# Patient Record
Sex: Female | Born: 1970 | Race: White | Hispanic: No | Marital: Married | Smoking: Never smoker
Health system: Southern US, Community
[De-identification: ages and names within clinical notes are randomized; demographics above are authoritative.]

## PROBLEM LIST (undated history)

## (undated) DIAGNOSIS — Z79890 Hormone replacement therapy (postmenopausal): Secondary | ICD-10-CM

## (undated) DIAGNOSIS — I10 Essential (primary) hypertension: Secondary | ICD-10-CM

## (undated) DIAGNOSIS — F419 Anxiety disorder, unspecified: Secondary | ICD-10-CM

## (undated) HISTORY — PX: ABDOMINAL HYSTERECTOMY: SHX81

---

## 2009-09-24 ENCOUNTER — Encounter: Admission: RE | Admit: 2009-09-24 | Discharge: 2009-09-24 | Payer: Self-pay | Admitting: Orthopedic Surgery

## 2010-03-11 ENCOUNTER — Ambulatory Visit: Payer: Self-pay | Admitting: Emergency Medicine

## 2010-03-11 DIAGNOSIS — J0100 Acute maxillary sinusitis, unspecified: Secondary | ICD-10-CM | POA: Insufficient documentation

## 2010-03-21 NOTE — Assessment & Plan Note (Signed)
Summary: SINUS INFEC?/TM room 4   Vital Signs:  Patient Profile:   40 Years Old Female CC:      possible sinus infection Weight:      173.75 pounds O2 Sat:      99 % O2 treatment:    Room Air Temp:     97.9 degrees F oral Pulse rate:   92 / minute Resp:     16 per minute BP sitting:   133 / 89  (left arm) Cuff size:   regular  Vitals Entered By: Clemens Catholic LPN (March 11, 2010 1:14 PM)                  Updated Prior Medication List: PRISTIQ 50 MG XR24H-TAB (DESVENLAFAXINE SUCCINATE)  VIVELLE-DOT 0.0375 MG/24HR PTTW (ESTRADIOL)   Current Allergies: No known allergies History of Present Illness History from: patient Chief Complaint: possible sinus infection History of Present Illness: 40 Years Old Female complains of onset of cold symptoms for3-4 days.  Aarica has been using Dayquil/Nyquil which is helping a little bit.  She had similar symptoms a few weeks ago. No sore throat + cough + upper teeth pain No pleuritic pain No wheezing + nasal congestion + post-nasal drainage + sinus pain/pressure No chest congestion No itchy/red eyes No earache No hemoptysis No SOB No chills/sweats No fever No nausea No vomiting No abdominal pain No diarrhea No skin rashes No fatigue No myalgias + headache   REVIEW OF SYSTEMS Constitutional Symptoms      Denies fever, chills, night sweats, weight loss, weight gain, and fatigue.  Eyes       Denies change in vision, eye pain, eye discharge, glasses, contact lenses, and eye surgery. Ear/Nose/Throat/Mouth       Complains of frequent runny nose, sinus problems, and sore throat.      Denies hearing loss/aids, change in hearing, ear pain, ear discharge, dizziness, frequent nose bleeds, hoarseness, and tooth pain or bleeding.  Respiratory       Complains of dry cough.      Denies productive cough, wheezing, shortness of breath, asthma, bronchitis, and emphysema/COPD.  Cardiovascular       Denies murmurs, chest pain, and  tires easily with exhertion.    Gastrointestinal       Denies stomach pain, nausea/vomiting, diarrhea, constipation, blood in bowel movements, and indigestion. Genitourniary       Denies painful urination, kidney stones, and loss of urinary control. Neurological       Complains of headaches.      Denies paralysis, seizures, and fainting/blackouts. Musculoskeletal       Denies muscle pain, joint pain, joint stiffness, decreased range of motion, redness, swelling, muscle weakness, and gout.  Skin       Denies bruising, unusual mles/lumps or sores, and hair/skin or nail changes.  Psych       Denies mood changes, temper/anger issues, anxiety/stress, speech problems, depression, and sleep problems. Other Comments: pt c/o sinus pressure, teeth hurt, cough, stuffy nose x 4 days. she has taken OTC dayquil.   Past History:  Past Medical History: Unremarkable  Past Surgical History: Hysterectomy  Family History: none  Social History: Never Smoked Alcohol use-yes socially Drug use-no Smoking Status:  never Drug Use:  no Physical Exam General appearance: well developed, well nourished, no acute distress Head: maxillary sinus tenderness Ears: normal, no lesions or deformities Nasal: clear discharge & congestion Oral/Pharynx: clear PND, no erythema Neck: neck supple,  trachea midline, no masses Chest/Lungs: no rales,  wheezes, or rhonchi bilateral, breath sounds equal without effort Heart: regular rate and  rhythm, no murmur MSE: oriented to time, place, and person Assessment New Problems: SINUSITIS, MAXILLARY, ACUTE (ICD-461.0)   Patient Education: Patient and/or caregiver instructed in the following: rest, fluids, Tylenol prn, Ibuprofen prn.  Plan New Medications/Changes: AMOXICILLIN 875 MG TABS (AMOXICILLIN) 1 by mouth two times a day for 7 days  #14 x 0, 03/11/2010, Hoyt Koch MD  New Orders: New Patient Level II [phone] Planning Comments:   1)  Take the  prescribed antibiotic as instructed.  Wait a few days prior to taking since likely viral at this time. 2)  Use nasal saline solution (over the counter) at least 3 times a day. 3)  Use over the counter decongestants like Zyrtec-D every 12 hours as needed to help with congestion. 4)  Can take tylenol every 6 hours or motrin every 8 hours for pain or fever. 5)  Follow up with your primary doctor  if no improvement in 5-7 days, sooner if increasing pain, fever, or new symptoms.    The patient and/or caregiver has been counseled thoroughly with regard to medications prescribed including dosage, schedule, interactions, rationale for use, and possible side effects and they verbalize understanding.  Diagnoses and expected course of recovery discussed and will return if not improved as expected or if the condition worsens. Patient and/or caregiver verbalized understanding.  Prescriptions: AMOXICILLIN 875 MG TABS (AMOXICILLIN) 1 by mouth two times a day for 7 days  #14 x 0   Entered and Authorized by:   Hoyt Koch MD   Signed by:   Hoyt Koch MD on 03/11/2010   Method used:   Print then Give to Patient   RxID:   [id number]   Orders Added: 1)  New Patient Level II [41660]

## 2011-02-03 ENCOUNTER — Emergency Department
Admission: EM | Admit: 2011-02-03 | Discharge: 2011-02-03 | Disposition: A | Discharge status: Home / Self Care | Payer: Self-pay | Source: Home / Self Care | Attending: Emergency Medicine | Admitting: Emergency Medicine

## 2011-02-03 ENCOUNTER — Encounter: Payer: Self-pay | Admitting: Emergency Medicine

## 2011-02-03 DIAGNOSIS — J329 Chronic sinusitis, unspecified: Secondary | ICD-10-CM

## 2011-02-03 HISTORY — DX: Hormone replacement therapy: Z79.890

## 2011-02-03 HISTORY — DX: Anxiety disorder, unspecified: F41.9

## 2011-02-03 MED ORDER — PSEUDOEPHEDRINE-GUAIFENESIN ER 60-600 MG PO TB12: ORAL_TABLET | ORAL | Status: AC

## 2011-02-03 MED ORDER — FLUTICASONE PROPIONATE 50 MCG/ACT NA SUSP: NASAL | Status: DC

## 2011-02-03 MED ORDER — AMOXICILLIN 875 MG PO TABS: 875.0000 mg | ORAL_TABLET | Freq: Two times a day (BID) | ORAL | Status: AC

## 2011-02-03 NOTE — ED Notes (Signed)
Sinus congestion, cough, and sore throat x 4 days. No Flu vaccine this season.

## 2011-02-03 NOTE — ED Provider Notes (Signed)
History   Several days of worsening congestion and cough, associated with discolored mucus and fever. Has tried over-the-counter treatments without significant improvement.  CSN: [id number]  Arrival date & time 02/03/11  1120   First MD Initiated Contact with Patient 02/03/11 1147      Chief Complaint  Patient presents with  . Nasal Congestion    (Consider location/radiation/quality/duration/timing/severity/associated sxs/prior treatment) HPI  Past Medical History  Diagnosis Date  . Anxiety   . Hormone replacement therapy (postmenopausal)     Past Surgical History  Procedure Date  . Abdominal hysterectomy     No family history on file.  History  Substance Use Topics  . Smoking status: Never Smoker   . Smokeless tobacco: Not on file  . Alcohol Use: No    OB History    Grav Para Term Preterm Abortions TAB SAB Ect Mult Living                  Review of Systems  Constitutional: Positive for fatigue.  HENT: Negative for hearing loss, nosebleeds and facial swelling.   Eyes: Negative.   Respiratory: Positive for cough (Minimal). Negative for chest tightness, shortness of breath, wheezing and stridor.   Cardiovascular: Negative.   Gastrointestinal: Negative.   Genitourinary: Negative.   Musculoskeletal: Negative.   Neurological: Negative.   Hematological: Negative.     Allergies  Review of patient's allergies indicates no known allergies.  Home Medications   Current Outpatient Rx  Name Route Sig Dispense Refill  . DESVENLAFAXINE SUCCINATE ER 50 MG PO TB24 Oral Take 50 mg by mouth daily.      Marland Kitchen ESTRADIOL 0.025 MG/24HR TD PTTW Transdermal Place 1 patch onto the skin 2 (two) times a week.      . AMOXICILLIN 875 MG PO TABS Oral Take 1 tablet (875 mg total) by mouth 2 (two) times daily. Take for 10 days. 20 tablet 0  . FLUTICASONE PROPIONATE 50 MCG/ACT NA SUSP  1 or 2 sprays each nostril twice a day 16 g 0  . PSEUDOEPHEDRINE-GUAIFENESIN 60-600 MG PO TB12  Take 1  every 12 hours as needed for congestion. Do not take if you have high blood pressure. Avoid taking late at night. 20 tablet 0    Pulse 94  Temp(Src) 98.4 F (36.9 C) (Oral)  Resp 16  Ht 5\' 6"  (1.676 m)  Wt 164 lb (74.39 kg)  BMI 26.47 kg/m2  SpO2 98%  Physical Exam  Nursing note and vitals reviewed. Constitutional: She is oriented to person, place, and time. She appears well-developed and well-nourished. No distress.  HENT:  Head: Normocephalic and atraumatic.  Right Ear: Tympanic membrane, external ear and ear canal normal.  Left Ear: Tympanic membrane, external ear and ear canal normal.  Nose: Mucosal edema and rhinorrhea present. Right sinus exhibits maxillary sinus tenderness. Left sinus exhibits maxillary sinus tenderness.  Mouth/Throat: Oropharynx is clear and moist. No oral lesions. No oropharyngeal exudate.  Eyes: Right eye exhibits no discharge. Left eye exhibits no discharge. No scleral icterus.  Neck: Neck supple.  Cardiovascular: Normal rate, regular rhythm and normal heart sounds.   Pulmonary/Chest: Effort normal and breath sounds normal. She has no wheezes. She has no rales.  Lymphadenopathy:    She has no cervical adenopathy.  Neurological: She is alert and oriented to person, place, and time.  Skin: Skin is warm and dry.    ED Course  Procedures (including critical care time)  Labs Reviewed - No data to display No results  found.   1. Sinusitis       MDM  See the AVS        Lonell Face, MD 02/03/11 1204

## 2015-06-30 ENCOUNTER — Emergency Department
Admission: EM | Admit: 2015-06-30 | Discharge: 2015-06-30 | Disposition: A | Discharge status: Home / Self Care | Payer: BC Managed Care – PPO | Source: Home / Self Care | Attending: Family Medicine | Admitting: Family Medicine

## 2015-06-30 DIAGNOSIS — R350 Frequency of micturition: Secondary | ICD-10-CM | POA: Diagnosis not present

## 2015-06-30 DIAGNOSIS — IMO0001 Reserved for inherently not codable concepts without codable children: Secondary | ICD-10-CM

## 2015-06-30 DIAGNOSIS — R1011 Right upper quadrant pain: Secondary | ICD-10-CM | POA: Diagnosis not present

## 2015-06-30 DIAGNOSIS — R109 Unspecified abdominal pain: Secondary | ICD-10-CM | POA: Diagnosis not present

## 2015-06-30 DIAGNOSIS — R030 Elevated blood-pressure reading, without diagnosis of hypertension: Secondary | ICD-10-CM | POA: Diagnosis not present

## 2015-06-30 LAB — POCT CBC W AUTO DIFF (K'VILLE URGENT CARE)

## 2015-06-30 LAB — POCT URINALYSIS DIP (MANUAL ENTRY)
Bilirubin, UA: NEGATIVE
Blood, UA: NEGATIVE
Glucose, UA: NEGATIVE
Ketones, POC UA: NEGATIVE
Nitrite, UA: NEGATIVE
Protein Ur, POC: NEGATIVE
Spec Grav, UA: 1.02 (ref 1.005–1.03)
Urobilinogen, UA: 0.2 (ref 0–1)
pH, UA: 7 (ref 5–8)

## 2015-06-30 MED ORDER — NAPROXEN 500 MG PO TABS
500.0000 mg | ORAL_TABLET | Freq: Two times a day (BID) | ORAL | Status: DC
Start: 2015-06-30 — End: 2016-01-21

## 2015-06-30 MED ORDER — CEPHALEXIN 500 MG PO CAPS: 500.0000 mg | ORAL_CAPSULE | Freq: Two times a day (BID) | ORAL | Status: DC

## 2015-06-30 MED ORDER — HYDROCODONE-ACETAMINOPHEN 5-325 MG PO TABS: 1.0000 | ORAL_TABLET | Freq: Four times a day (QID) | ORAL | Status: DC | PRN

## 2015-06-30 MED ORDER — IBUPROFEN 600 MG PO TABS
600.0000 mg | ORAL_TABLET | Freq: Four times a day (QID) | ORAL | Status: AC | PRN
  Administered 2015-06-30: 600 mg via ORAL

## 2015-06-30 NOTE — ED Provider Notes (Signed)
CSN: [id number]     Arrival date & time 06/30/15  1907 History   None    Chief Complaint  Patient presents with  . Back Pain   (Consider location/radiation/quality/duration/timing/severity/associated sxs/prior Treatment) HPI The pt is a 45yo female presenting to [facility] with c/o sudden onset Right sided flank pain that started yesterday morning and has gradually worsened since.  She has tried a heating pad and ibuprofen with minimal relief.  Difficulty getting comfortable all day. Pain is 9/10 at this time.  Denies n/v/d, fever or chills. She has noticed urinary frequency but no pain with urination or hematuria. Denies hx of kidney stones. No hx of abdominal surgeries. Denies spinal back pain but has had some back pain under her Right shoulder blade.    BP elevated in triage. Pt denies headache, dizziness, chest pain or SOB. No hx of HTN however pain is 9/10.   Past Medical History  Diagnosis Date  . Anxiety   . Hormone replacement therapy (postmenopausal)    Past Surgical History  Procedure Laterality Date  . Abdominal hysterectomy     History reviewed. No pertinent family history. Social History  Substance Use Topics  . Smoking status: Never Smoker   . Smokeless tobacco: None  . Alcohol Use: No   OB History    No data available     Review of Systems  Constitutional: Negative for fever and chills.  HENT: Negative for congestion, ear pain, sore throat, trouble swallowing and voice change.   Respiratory: Negative for cough and shortness of breath.   Cardiovascular: Negative for chest pain and palpitations.  Gastrointestinal: Positive for abdominal pain (Right side). Negative for nausea, vomiting and diarrhea.  Genitourinary: Positive for frequency and flank pain ( Right). Negative for dysuria, urgency and hematuria.  Musculoskeletal: Positive for back pain (Right side). Negative for myalgias and arthralgias.  Skin: Negative for rash.    Allergies  Review of patient's allergies  indicates no known allergies.  Home Medications   Prior to Admission medications   Medication Sig Start Date End Date Taking? Authorizing Provider  cephALEXin (KEFLEX) 500 MG capsule Take 1 capsule (500 mg total) by mouth 2 (two) times daily. For 7 days 06/30/15   Junius Finner, PA-C  desvenlafaxine (PRISTIQ) 50 MG 24 hr tablet Take 50 mg by mouth daily.      Historical Provider, MD  estradiol (VIVELLE-DOT) 0.025 MG/24HR Place 1 patch onto the skin 2 (two) times a week.      Historical Provider, MD  fluticasone Aleda Grana) 50 MCG/ACT nasal spray 1 or 2 sprays each nostril twice a day 02/03/11 02/03/12  Lajean Manes, MD  HYDROcodone-acetaminophen (NORCO/VICODIN) 5-325 MG tablet Take 1 tablet by mouth every 6 (six) hours as needed for moderate pain or severe pain. 06/30/15   Junius Finner, PA-C  naproxen (NAPROSYN) 500 MG tablet Take 1 tablet (500 mg total) by mouth 2 (two) times daily. 06/30/15   Junius Finner, PA-C   Meds Ordered and Administered this Visit   Medications  ibuprofen (ADVIL,MOTRIN) tablet 600 mg (600 mg Oral Given 06/30/15 1936)    BP 166/101 mmHg  Pulse 97  Temp(Src) 98.4 F (36.9 C) (Oral)  Ht  (1.676 m)  SpO2 98% No data found.   Physical Exam  Constitutional: She appears well-developed and well-nourished.  Standing in exam room, pacing, appears uncomfortable.   HENT:  Head: Normocephalic and atraumatic.  Mouth/Throat: Oropharynx is clear and moist.  Eyes: Conjunctivae are normal. No scleral icterus.  Neck:  Normal range of motion.  Cardiovascular: Normal rate, regular rhythm and normal heart sounds.   Pulmonary/Chest: Effort normal and breath sounds normal. No respiratory distress. She has no wheezes. She has no rales. She exhibits no tenderness.  Abdominal: Soft. Bowel sounds are normal. She exhibits no distension and no mass. There is tenderness. There is no rebound, no guarding and no CVA tenderness.    Soft, non-distended. Diffuse upper and Right sided  abdominal and flank pain. No guarding or rebound.   Musculoskeletal: Normal range of motion.  Neurological: She is alert.  Skin: Skin is warm and dry. She is not diaphoretic.  Nursing note and vitals reviewed.   ED Course  Procedures (including critical care time)  Labs Review Labs Reviewed  COMPLETE METABOLIC PANEL WITH GFR - Abnormal; Notable for the following:    Glucose, Bld 103 (*)    All other components within normal limits   Narrative:    Performed at:  [facility]                [address], [address]                [place], [place] [id number]  POCT URINALYSIS DIP (MANUAL ENTRY) - Abnormal; Notable for the following:    Leukocytes, UA Trace (*)    All other components within normal limits  URINE CULTURE  POCT CBC W AUTO DIFF ([facility])    Imaging Review No results found.    MDM   1. Right flank pain   2. Urinary frequency   3. Abdominal pain, right upper quadrant   4. Elevated blood pressure    Pt c/o Right flank pain, urinary frequency since yesterday. Tenderness to RUQ on exam and Right flank.  BP elevated. No prior hx of HTN.  Possibly elevated due to pain. Pt denies chest pain, headache, dizziness or SOB.  Ibuprofen 600mg  given in UC. UA: trace leukocytes, will send culture but also treat for potential early UTI CBC: unremarkable  CMP pending.  Lower concern for cholecystitis due to pt being afebrile, no leukocytosis, or vomiting and urinary frequency present. Pain more likely due to kidney stone.  CT and U/S unavailable at this time.  Advised pt she go to emergency department later tonight or this weekend if pain persists despite taking Vicodin being prescribed, or if symptoms worsen including fever develops or vomiting.  Encouraged to f/u with PCP next week for BP recheck. Patient and husband verbalized understanding and agreement with treatment plan.     Junius FinnerErin O'Malley, PA-C 07/01/15 [phone]

## 2015-06-30 NOTE — ED Notes (Signed)
Rechecked BP 169/107.  E O'malley PA-C notified

## 2015-06-30 NOTE — ED Notes (Signed)
Noticed some frequency with urination today.  Denies burning.

## 2015-06-30 NOTE — Discharge Instructions (Signed)
°  Norco/Vicodin (hydrocodone-acetaminophen) is a narcotic pain medication, do not combine these medications with others containing tylenol. While taking, do not drink alcohol, drive, or perform any other activities that requires focus while taking these medications.  ° °

## 2015-06-30 NOTE — ED Notes (Signed)
Started yesterday morning with right side pain.  Became worse as the day progressed.  Tried ibuprofen and heat last night, but could not get comfortable.  Has become worse today.

## 2015-07-01 ENCOUNTER — Telehealth: Payer: Self-pay | Admitting: Emergency Medicine

## 2015-07-01 LAB — COMPLETE METABOLIC PANEL WITH GFR
ALT: 17 U/L (ref 6–29)
AST: 18 U/L (ref 10–35)
Albumin: 4.4 g/dL (ref 3.6–5.1)
Alkaline Phosphatase: 75 U/L (ref 33–115)
BUN: 11 mg/dL (ref 7–25)
CO2: 25 mmol/L (ref 20–31)
Calcium: 9.3 mg/dL (ref 8.6–10.2)
Chloride: 103 mmol/L (ref 98–110)
Creat: 0.93 mg/dL (ref 0.50–1.10)
GFR, Est African American: 85 mL/min (ref >=60)
GFR, Est Non African American: 74 mL/min (ref >=60)
Glucose, Bld: 103 mg/dL — ABNORMAL HIGH (ref 65–99)
Potassium: 4.1 mmol/L (ref 3.5–5.3)
Sodium: 140 mmol/L (ref 135–146)
Total Bilirubin: 0.6 mg/dL (ref 0.2–1.2)
Total Protein: 7.1 g/dL (ref 6.1–8.1)

## 2015-07-02 LAB — URINE CULTURE: Colony Count: 70000

## 2016-01-21 ENCOUNTER — Encounter: Payer: Self-pay | Admitting: Emergency Medicine

## 2016-01-21 ENCOUNTER — Emergency Department
Admission: EM | Admit: 2016-01-21 | Discharge: 2016-01-21 | Disposition: A | Discharge status: Home / Self Care | Payer: BC Managed Care – PPO | Source: Home / Self Care | Attending: Family Medicine | Admitting: Family Medicine

## 2016-01-21 DIAGNOSIS — R110 Nausea: Secondary | ICD-10-CM

## 2016-01-21 DIAGNOSIS — R059 Cough, unspecified: Secondary | ICD-10-CM

## 2016-01-21 DIAGNOSIS — R05 Cough: Secondary | ICD-10-CM

## 2016-01-21 DIAGNOSIS — R197 Diarrhea, unspecified: Secondary | ICD-10-CM | POA: Diagnosis not present

## 2016-01-21 DIAGNOSIS — R519 Headache, unspecified: Secondary | ICD-10-CM

## 2016-01-21 DIAGNOSIS — R51 Headache: Secondary | ICD-10-CM

## 2016-01-21 LAB — POCT CBC W AUTO DIFF (K'VILLE URGENT CARE)

## 2016-01-21 MED ORDER — FLUTICASONE PROPIONATE 50 MCG/ACT NA SUSP: 2.0000 | Freq: Every day | NASAL | 2 refills | Status: AC

## 2016-01-21 MED ORDER — BENZONATATE 100 MG PO CAPS: 100.0000 mg | ORAL_CAPSULE | Freq: Three times a day (TID) | ORAL | 0 refills | Status: DC

## 2016-01-21 MED ORDER — PROMETHAZINE HCL 25 MG PO TABS: 25.0000 mg | ORAL_TABLET | Freq: Four times a day (QID) | ORAL | 0 refills | Status: DC | PRN

## 2016-01-21 MED ORDER — PROMETHAZINE HCL 25 MG/ML IJ SOLN
25.0000 mg | Freq: Once | INTRAMUSCULAR | Status: AC
  Administered 2016-01-21: 25 mg via INTRAMUSCULAR

## 2016-01-21 MED ORDER — KETOROLAC TROMETHAMINE 60 MG/2ML IM SOLN
60.0000 mg | Freq: Once | INTRAMUSCULAR | Status: AC
  Administered 2016-01-21: 60 mg via INTRAMUSCULAR

## 2016-01-21 NOTE — ED Triage Notes (Signed)
Pt c/o cough, nausea, diarrhea x 3 days, dry heaving a lot, has already taking Zofran x 4 this morning.

## 2016-01-21 NOTE — Discharge Instructions (Signed)
°  Your symptoms are likely due to a viral illness.  It is important to get at least 8-10 hours of sleep at night while sick.  Be sure to stay well hydrated, you may also drink sports drinks such as Gatorade and Powerade to help replenish electrolytes lost with diarrhea or if you develop vomiting.  If the strong taste causes/worsens nausea, you may water these drinks down. Broth, crackers, rice, and other bland foods are good to eat while sick.  Avoid fried/fatty foods or dairy which can cause the stomach to work harder.   If you develop severe abdominal pain, cannot keep fluids down, chest pain, difficulty breathing, passing out, or other new concerning symptoms, please call 911 or go to closest emergency department.

## 2016-01-21 NOTE — ED Provider Notes (Signed)
CSN: [id number]     Arrival date & time 01/21/16  1130 History   First MD Initiated Contact with Patient 01/21/16 1143     Chief Complaint  Patient presents with  . Nausea  . Diarrhea  . Cough   (Consider location/radiation/quality/duration/timing/severity/associated sxs/prior Treatment) HPI Lucille PassyMelissa Detter is a 45 y.o. female presenting to UC with c/o 3 days of intermittent dry cough, with associated nausea with dry heaves this morning as well as an episode of loose stools this morning w/o blood or mucous in the stools.  Pt notes she took 4mg  zofran this morning w/o relief.  She also took some leftover cough medication with hydrocodone last night w/o relief.  She has not ate or drank anything this morning due to the nausea. Pt also c/o mild frontal headache but denies dizziness or change in vision. She has not taken any acetaminophen or ibuprofen for her headache.  Denies hx of migraines. Denies nasal congestion or sore throat. Denies fever or chills.  Denies abdominal pain or urinary symptoms.  She did get the flu vaccine this season.  No sick contacts or recent travel.     Past Medical History:  Diagnosis Date  . Anxiety   . Hormone replacement therapy (postmenopausal)    Past Surgical History:  Procedure Laterality Date  . ABDOMINAL HYSTERECTOMY     No family history on file. Social History  Substance Use Topics  . Smoking status: Never Smoker  . Smokeless tobacco: Never Used  . Alcohol use No   OB History    No data available     Review of Systems  Constitutional: Positive for appetite change and fatigue. Negative for chills and fever.  HENT: Positive for sinus pain. Negative for congestion, ear pain, sore throat, trouble swallowing and voice change.   Respiratory: Positive for cough. Negative for shortness of breath.   Cardiovascular: Negative for chest pain and palpitations.  Gastrointestinal: Positive for diarrhea ( loose stool) and nausea. Negative for abdominal pain  and vomiting.  Musculoskeletal: Negative for arthralgias, back pain and myalgias.  Skin: Negative for rash.  Neurological: Positive for headaches ( frontal). Negative for dizziness and light-headedness.    Allergies  Patient has no known allergies.  Home Medications   Prior to Admission medications   Medication Sig Start Date End Date Taking? Authorizing Provider  desvenlafaxine (PRISTIQ) 50 MG 24 hr tablet Take 50 mg by mouth daily.      Historical Provider, MD  fluticasone (FLONASE) 50 MCG/ACT nasal spray Place 2 sprays into both nostrils daily. 01/21/16   Junius FinnerErin O'Malley, PA-C  promethazine (PHENERGAN) 25 MG tablet Take 1 tablet (25 mg total) by mouth every 6 (six) hours as needed for nausea or vomiting. 01/21/16   Junius FinnerErin O'Malley, PA-C   Meds Ordered and Administered this Visit   Medications  ketorolac (TORADOL) injection 60 mg (60 mg Intramuscular Given 01/21/16 1155)  promethazine (PHENERGAN) injection 25 mg (25 mg Intramuscular Given 01/21/16 1222)    BP 139/90 (BP Location: Left Arm)   Pulse 91   Temp 97.8 F (36.6 C) (Oral)   Ht 5\' 6"  (1.676 m)   Wt 175 lb (79.4 kg)   SpO2 95%   BMI 28.25 kg/m  No data found.   Physical Exam  Constitutional: She is oriented to person, place, and time. She appears well-developed and well-nourished. No distress.  Pt lying on exam bed, does not appear to feel well but appears well hydrated and cooperative during exam.  HENT:  Head: Normocephalic and atraumatic.  Right Ear: Tympanic membrane normal.  Left Ear: Tympanic membrane normal.  Nose: Nose normal.  Mouth/Throat: Uvula is midline, oropharynx is clear and moist and mucous membranes are normal.  Eyes: EOM are normal.  Neck: Normal range of motion. Neck supple.  Cardiovascular: Normal rate and regular rhythm.   Pulmonary/Chest: Effort normal and breath sounds normal. No stridor. No respiratory distress. She has no wheezes. She has no rales.  Abdominal: Soft. She exhibits no  distension and no mass. There is no tenderness. There is no rebound, no guarding and no CVA tenderness.  Musculoskeletal: Normal range of motion.  Lymphadenopathy:    She has no cervical adenopathy.  Neurological: She is alert and oriented to person, place, and time.  Skin: Skin is warm and dry. She is not diaphoretic.  Psychiatric: She has a normal mood and affect. Her behavior is normal.  Nursing note and vitals reviewed.   Urgent Care Course   Clinical Course     Procedures (including critical care time)  Labs Review Labs Reviewed  POCT CBC W AUTO DIFF ([facility])    Imaging Review No results found.   Pt given Toradol 60mg  IM for headache, ice chips and crackers. Pt c/o severe nausea after eating a cracker. Phenergan 25mg  IM given.  Pt monitored for 20 minutes after phenergan. No vomiting.  Vitals: WNL  MDM   1. Cough   2. Nausea   3. Diarrhea, unspecified type   4. Frontal headache    Pt c/o 3 days of dry cough, that has developed into severe nausea and 1 episode of loose stools this morning. Pt appears fatigued but cooperative during exam. Afebrile. Moist mucous membranes. Lungs: CTAB. Abd: soft, non-tender. No coughing, dry heaving, or vomiting while in UC.  CBC: WNL   Pt feels comfortable being discharged home.  Daughter driving pt home.  Rx: phenergan, tessalon, and flonase  F/u with PCP in 3-4 days if not improving, sooner if worsening or go to emergency department.      Junius Finnerrin O'Malley, PA-C 01/21/16 [phone]

## 2016-08-05 ENCOUNTER — Emergency Department
Admission: EM | Admit: 2016-08-05 | Discharge: 2016-08-05 | Disposition: A | Discharge status: Home / Self Care | Payer: BC Managed Care – PPO | Source: Home / Self Care | Attending: Family Medicine | Admitting: Family Medicine

## 2016-08-05 ENCOUNTER — Encounter: Payer: Self-pay | Admitting: Emergency Medicine

## 2016-08-05 DIAGNOSIS — K1379 Other lesions of oral mucosa: Secondary | ICD-10-CM | POA: Diagnosis not present

## 2016-08-05 DIAGNOSIS — B029 Zoster without complications: Secondary | ICD-10-CM

## 2016-08-05 MED ORDER — PREDNISONE 20 MG PO TABS: ORAL_TABLET | ORAL | 0 refills | Status: DC

## 2016-08-05 MED ORDER — VALACYCLOVIR HCL 1 G PO TABS: 1000.0000 mg | ORAL_TABLET | Freq: Three times a day (TID) | ORAL | 0 refills | Status: DC

## 2016-08-05 MED ORDER — MAGIC MOUTHWASH W/LIDOCAINE: 5.0000 mL | Freq: Three times a day (TID) | ORAL | 0 refills | Status: DC | PRN

## 2016-08-05 NOTE — ED Triage Notes (Signed)
Saw dentist on Friday, Mouth lesions knot under chin mostly on left

## 2016-08-05 NOTE — ED Provider Notes (Signed)
CSN: [id number]     Arrival date & time 08/05/16  1613 History   First MD Initiated Contact with Patient 08/05/16 1702     Chief Complaint  Patient presents with  . Mouth Lesions   (Consider location/radiation/quality/duration/timing/severity/associated sxs/prior Treatment) HPI Allison Werner is a 46 y.o. female presenting to UC with c/o gradually worsening sores in her mouth and on the Left side of her chin.  Symptoms started about 4 days ago.  She was seen by her dentist on Friday, 08/02/16, started on penicillin for possible dental abscess but was seen again today by her dentist and got x-rays, which showed no signs of an abscess.  He did "cauterize" the lesions in her mouth today. Rash on her face is becoming more painful. Pain is an itching and burning sensation along with a tender bump under her chin and along Left jaw causing Left ear pain.  Denies fever, chills, n/v/d. Hx of shingles several years ago that were on her chest.  Pain feels similar to last time. She notes she has been under a lot of stress due to her husband's recent dx of ALS.  She notes last time she has shingles she was also under a lot of stress.    Past Medical History:  Diagnosis Date  . Anxiety   . Hormone replacement therapy (postmenopausal)    Past Surgical History:  Procedure Laterality Date  . ABDOMINAL HYSTERECTOMY     No family history on file. Social History  Substance Use Topics  . Smoking status: Never Smoker  . Smokeless tobacco: Never Used  . Alcohol use No   OB History    No data available     Review of Systems  Constitutional: Negative for chills and fever.  HENT: Positive for mouth sores. Negative for dental problem, facial swelling and sore throat.   Skin: Positive for rash.    Allergies  Patient has no known allergies.  Home Medications   Prior to Admission medications   Medication Sig Start Date End Date Taking? Authorizing Provider  desvenlafaxine (PRISTIQ) 50 MG 24 hr tablet  Take 50 mg by mouth daily.      [provider]  fluticasone (FLONASE) 50 MCG/ACT nasal spray Place 2 sprays into both nostrils daily. 01/21/16   Lurene Shadow, PA-C  magic mouthwash w/lidocaine SOLN Take 5 mLs by mouth 3 (three) times daily as needed for mouth pain. 08/05/16   Lurene Shadow, PA-C  predniSONE (DELTASONE) 20 MG tablet 3 tabs po day one, then 2 po daily x 4 days 08/05/16   Lurene Shadow, PA-C  valACYclovir (VALTREX) 1000 MG tablet Take 1 tablet (1,000 mg total) by mouth 3 (three) times daily. 08/05/16   Lurene Shadow, PA-C   Meds Ordered and Administered this Visit  Medications - No data to display  BP (!) 145/89 (BP Location: Left Arm)   Pulse (!) 101   Temp 98.6 F (37 C) (Oral)   Ht 5\' 6"  (1.676 m)   Wt 173 lb (78.5 kg)   SpO2 98%   BMI 27.92 kg/m  No data found.   Physical Exam  Constitutional: She is oriented to person, place, and time. She appears well-developed and well-nourished. No distress.  HENT:  Head: Normocephalic and atraumatic.    Right Ear: Tympanic membrane normal.  Left Ear: Tympanic membrane normal.  Nose: Nose normal.  Mouth/Throat: Uvula is midline, oropharynx is clear and moist and mucous membranes are normal. Oral lesions present. No trismus  in the jaw. No dental abscesses or uvula swelling.    Oral lesion- multiple erythematous shallow ulcerations on tip of tongue and under tongue on Left side. One sore on Left side roof of mouth. Left side of chin: erythematous papular lesions with sparse overlying vesicles. Tender.   Eyes: EOM are normal.  Neck: Normal range of motion.  Submandibular and Left side preauricular lymphadenopathy.   Cardiovascular: Normal rate.   Pulmonary/Chest: Effort normal.  Musculoskeletal: Normal range of motion.  Neurological: She is alert and oriented to person, place, and time.  Skin: Skin is warm and dry. She is not diaphoretic.  Psychiatric: She has a normal mood and affect. Her behavior is normal.    Nursing note and vitals reviewed.   Urgent Care Course     Procedures (including critical care time)  Labs Review Labs Reviewed - No data to display  Imaging Review No results found.    MDM   1. Other lesions of oral mucosa   2. Herpes zoster without complication    Hx and exam most c/w herpes zoster w/o complication  Rx: Valtrex, prednisone, and magic mouthwash w/ lidocaine  Home care instructions provided F/u with PCP in 1 week if not improving, sooner if worsening.     Lurene Shadowhelps, Yaretzy Olazabal O, PA-C 08/06/16 0930

## 2017-04-19 ENCOUNTER — Encounter: Payer: Self-pay | Admitting: Emergency Medicine

## 2017-04-19 ENCOUNTER — Emergency Department
Admission: EM | Admit: 2017-04-19 | Discharge: 2017-04-19 | Disposition: A | Discharge status: Home / Self Care | Payer: BC Managed Care – PPO | Source: Home / Self Care | Attending: Emergency Medicine | Admitting: Emergency Medicine

## 2017-04-19 DIAGNOSIS — J029 Acute pharyngitis, unspecified: Secondary | ICD-10-CM

## 2017-04-19 DIAGNOSIS — J111 Influenza due to unidentified influenza virus with other respiratory manifestations: Secondary | ICD-10-CM

## 2017-04-19 DIAGNOSIS — R69 Illness, unspecified: Secondary | ICD-10-CM | POA: Diagnosis not present

## 2017-04-19 LAB — POCT INFLUENZA A/B
Influenza A, POC: NEGATIVE
Influenza B, POC: NEGATIVE

## 2017-04-19 MED ORDER — AMOXICILLIN 875 MG PO TABS: ORAL_TABLET | ORAL | 0 refills | Status: DC

## 2017-04-19 MED ORDER — OSELTAMIVIR PHOSPHATE 75 MG PO CAPS: ORAL_CAPSULE | ORAL | 0 refills | Status: DC

## 2017-04-19 NOTE — ED Triage Notes (Signed)
Patient presents to [facility] with C/O flu like symptoms times two days

## 2017-04-19 NOTE — ED Provider Notes (Signed)
[facility]    CSN: [id number] Arrival date & time: 04/19/17  1645     History   Chief Complaint Chief Complaint  Patient presents with  . Influenza    HPI Allison Werner is a 47 y.o. female.   HPI FLU  HPI : Flu symptoms for about 1 day. Fever to 102 with chills, sweats, myalgias, fatigue, headache. Symptoms are progressively worsening, despite trying OTC fever reducing medicine and rest and fluids. Has decreased appetite, but tolerating some liquids by mouth. No history of recent tick bite. Exposed to several people with influenza. Also, complains of severe sore throat with swollen neck glands.  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 Negative for acute vision changes, stiff neck, focal weakness, syncope, seizures, respiratory distress, vomiting, diarrhea, GU symptoms, new rash.  Past Medical History:  Diagnosis Date  . Anxiety   . Hormone replacement therapy (postmenopausal)     Patient Active Problem List   Diagnosis Date Noted  . SINUSITIS, MAXILLARY, ACUTE 03/11/2010    Past Surgical History:  Procedure Laterality Date  . ABDOMINAL HYSTERECTOMY      OB History    No data available       Home Medications    Prior to Admission medications   Medication Sig Start Date End Date Taking? Authorizing Provider  amoxicillin (AMOXIL) 875 MG tablet Take 1 twice a day X 10 days. 04/19/17   Lajean ManesMassey, David, MD  desvenlafaxine (PRISTIQ) 50 MG 24 hr tablet Take 50 mg by mouth daily.      [provider]  fluticasone (FLONASE) 50 MCG/ACT nasal spray Place 2 sprays into both nostrils daily. 01/21/16   Lurene ShadowPhelps, Erin O, PA-C  magic mouthwash w/lidocaine SOLN Take 5 mLs by mouth 3 (three) times daily as needed for mouth pain. 08/05/16   Lurene ShadowPhelps, Erin O, PA-C  oseltamivir (TAMIFLU) 75 MG capsule Starting today, take 1 capsule by  mouth twice a day for 5 days. 04/19/17   Lajean ManesMassey, David, MD  predniSONE (DELTASONE) 20 MG tablet 3 tabs po day one, then 2 po daily x 4 days 08/05/16   Lurene ShadowPhelps, Erin O, PA-C  valACYclovir (VALTREX) 1000 MG tablet Take 1 tablet (1,000 mg total) by mouth 3 (three) times daily. 08/05/16   Lurene ShadowPhelps, Erin O, PA-C    Family History History reviewed. No pertinent family history.  Social History Social History   Tobacco Use  . Smoking status: Never Smoker  . Smokeless tobacco: Never Used  Substance Use Topics  . Alcohol use: No  . Drug use: No     Allergies   Patient has no known allergies.   Review of Systems Review of Systems  All other systems reviewed and are negative.    Physical Exam Triage Vital Signs ED Triage Vitals  Enc Vitals Group     BP 04/19/17 1806 (!) 173/102     Pulse Rate 04/19/17 1806 (!) 126     Resp 04/19/17 1806 16     Temp 04/19/17 1806 99.4 F (37.4 C)     Temp Source 04/19/17 1806 Oral     SpO2 04/19/17 1806 98 %     Weight 04/19/17 1807 176 lb 8 oz (80.1 kg)     Height 04/19/17 1807 5\' 6"  (1.676 m)     Head Circumference --      Peak Flow --  Pain Score 04/19/17 1807 6     Pain Loc --      Pain Edu? --      Excl. in GC? --    No data found.  Updated Vital Signs BP (!) 153/109 (BP Location: Right Arm)   Pulse (!) 126   Temp 99.4 F (37.4 C) (Oral)   Resp 16   Ht 5\' 6"  (1.676 m)   Wt 176 lb 8 oz (80.1 kg)   SpO2 98%   BMI 28.49 kg/m   Pulse rechecked, 104, regular Physical Exam  Constitutional: She appears well-developed and well-nourished.  Non-toxic appearance. She appears ill (very fatigued, but no cardiorespiratory distress). No distress.  HENT:  Head: Normocephalic and atraumatic.  Right Ear: Tympanic membrane and external ear normal.  Left Ear: Tympanic membrane and external ear normal.  Nose: Rhinorrhea present.  Mouth/Throat: Mucous membranes are normal. Posterior oropharyngeal erythema (Very red, 2+ tonsils bilaterally.  No  exudate.  Airway intact) present.  Eyes: Conjunctivae are normal. Right eye exhibits no discharge. Left eye exhibits no discharge. No scleral icterus.  Neck: Neck supple.  Cardiovascular: Normal rate, regular rhythm and normal heart sounds.  Pulmonary/Chest: Breath sounds normal. No stridor. No respiratory distress. She has no wheezes. She has no rales.  Abdominal: Soft. There is no tenderness.  Musculoskeletal: She exhibits no edema.  Lymphadenopathy:    She has cervical adenopathy (mild shoddy anterior cervical nodes).  Neurological: She is alert.  Skin: Skin is warm and intact. No rash noted. She is diaphoretic.  Psychiatric: She has a normal mood and affect.  Nursing note and vitals reviewed.    UC Treatments / Results  Labs (all labs ordered are listed, but only abnormal results are displayed) Labs Reviewed  POCT INFLUENZA A/B  Today, rapid tests for influenza a and B are negative. She declined doing rapid strep test  EKG  EKG Interpretation None       Radiology No results found.  Procedures Procedures (including critical care time)  Medications Ordered in UC Medications - No data to display   Initial Impression / Assessment and Plan / UC Course  I have reviewed the triage vital signs and the nursing notes.  Pertinent labs & imaging results that were available during my care of the patient were reviewed by me and considered in my medical decision making (see chart for details).     Although rapid flu test negative, she has classic signs and symptoms and findings for acute influenza, which started yesterday. After risk benefits alternatives discussed, Tamiflu prescribed. 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  Other symptomatic care discussed at length. Other advice given May return to work when she is fever free for at least 24 hours  Final Clinical Impressions(s) / UC Diagnoses   Final diagnoses:  Influenza-like illness  Acute pharyngitis, unspecified etiology    ED Discharge Orders        Ordered    amoxicillin (AMOXIL) 875 MG tablet     04/19/17 1846    oseltamivir (TAMIFLU) 75 MG capsule     04/19/17 1846    Amoxicillin 875 twice daily for 10 days Tamiflu 75 twice daily for 5 days Follow-up with your primary care doctor in  5-7 days if not improving, or sooner if symptoms become worse. Precautions discussed. Red flags discussed. Questions invited and answered. Patient voiced understanding and agreement.   Controlled Substance Prescriptions [place] Controlled Substance Registry consulted? Not Applicable   Lajean Manes, MD 04/21/17 1510

## 2019-09-02 ENCOUNTER — Emergency Department (INDEPENDENT_AMBULATORY_CARE_PROVIDER_SITE_OTHER): Payer: BC Managed Care – PPO

## 2019-09-02 ENCOUNTER — Other Ambulatory Visit: Payer: Self-pay

## 2019-09-02 ENCOUNTER — Emergency Department
Admission: RE | Admit: 2019-09-02 | Discharge: 2019-09-02 | Disposition: A | Discharge status: Home / Self Care | Payer: BC Managed Care – PPO | Source: Ambulatory Visit

## 2019-09-02 VITALS — BP 136/88 | HR 86 | Temp 98.2°F | Resp 18

## 2019-09-02 DIAGNOSIS — R197 Diarrhea, unspecified: Secondary | ICD-10-CM | POA: Diagnosis not present

## 2019-09-02 DIAGNOSIS — R079 Chest pain, unspecified: Secondary | ICD-10-CM | POA: Diagnosis not present

## 2019-09-02 DIAGNOSIS — R1013 Epigastric pain: Secondary | ICD-10-CM

## 2019-09-02 LAB — POCT CBC W AUTO DIFF (K'VILLE URGENT CARE)

## 2019-09-02 MED ORDER — ALUM & MAG HYDROXIDE-SIMETH 200-200-20 MG/5ML PO SUSP
30.0000 mL | Freq: Once | ORAL | Status: AC
  Administered 2019-09-02: 30 mL via ORAL

## 2019-09-02 MED ORDER — LIDOCAINE VISCOUS HCL 2 % MT SOLN
15.0000 mL | Freq: Once | OROMUCOSAL | Status: AC
  Administered 2019-09-02: 15 mL via ORAL

## 2019-09-02 NOTE — ED Triage Notes (Signed)
Pt c/o chest pain/"contractions" since last night. Says it comes and goes. Sometimes feels like acid reflux. Says episodes last only a few seconds at a time. Also c/o diarrhea, 5 episodes today.

## 2019-09-02 NOTE — Discharge Instructions (Signed)
  Because the medication given in urgent care has not helped your symptoms and the pain is still severe, it is recommended you are evaluated further in the emergency department.  You have declined EMS transport. Please drive yourself safely and directly to the hospital. If you need to pull over, pull to a safe area and call 911.  Do not eat or drink anything along the way.

## 2019-09-02 NOTE — ED Provider Notes (Addendum)
[name]    CSN: [id number] Arrival date & time: 09/02/19  1740      History   Chief Complaint Chief Complaint  Patient presents with  . Appointment    6pm  . Chest Pain    HPI Allison Werner is a 50 y.o. female.   HPI  Allison Werner is a 49 y.o. female presenting to UC with c/o intermittent centralized chest pain that feels like "contractions" that started last night.  Pain lasts a few seconds at a time but isa 10/10 at worst, causing her to clutch her chest and stop in her tracks. She now has chest soreness at rest, she thinks from pushing on her chest when the pain comes.  It occasionally feels like reflux but she has tried Tums and Pepto-bismol without relief. She has never needed prescription antiacid medication before. She does report five episodes of loose stools, no blood or mucous. Denies fever, chills nausea or vomiting. No sick contacts or recent travel.  No hx of heart problems, no family hx of heart disease. No hx of blood clots. No leg pain or swelling.    Past Medical History:  Diagnosis Date  . Anxiety   . Hormone replacement therapy (postmenopausal)     Patient Active Problem List   Diagnosis Date Noted  . SINUSITIS, MAXILLARY, ACUTE 03/11/2010    Past Surgical History:  Procedure Laterality Date  . ABDOMINAL HYSTERECTOMY      OB History   No obstetric history on file.      Home Medications    Prior to Admission medications   Medication Sig Start Date End Date Taking? Authorizing Provider  buPROPion (WELLBUTRIN XL) 150 MG 24 hr tablet TAKE 1 TABLET BY MOUTH EVERY DAY IN THE MORNING 06/28/19  Yes [provider]  cloNIDine (CATAPRES) 0.1 MG tablet Take by mouth. 01/25/19  Yes [provider]  hydrochlorothiazide (HYDRODIURIL) 25 MG tablet Take by mouth. 08/06/18  Yes [provider]  nebivolol (BYSTOLIC) 5 MG tablet TAKE ONE TABLET (5 MG DOSE) BY MOUTH DAILY. 05/21/19  Yes [provider]    desvenlafaxine (PRISTIQ) 50 MG 24 hr tablet Take 50 mg by mouth daily.      [provider]  fluticasone (FLONASE) 50 MCG/ACT nasal spray Place 2 sprays into both nostrils daily. 01/21/16   Lurene Shadow, PA-C  magic mouthwash w/lidocaine SOLN Take 5 mLs by mouth 3 (three) times daily as needed for mouth pain. 08/05/16   Lurene Shadow, PA-C  predniSONE (DELTASONE) 20 MG tablet 3 tabs po day one, then 2 po daily x 4 days 08/05/16   Rolla Plate    Family History History reviewed. No pertinent family history.  Social History Social History   Tobacco Use  . Smoking status: Never Smoker  . Smokeless tobacco: Never Used  Vaping Use  . Vaping Use: Never used  Substance Use Topics  . Alcohol use: No  . Drug use: No     Allergies   Patient has no known allergies.   Review of Systems Review of Systems  Constitutional: Negative for chills and fever.  HENT: Negative for congestion, ear pain, sore throat, trouble swallowing and voice change.   Respiratory: Positive for cough (mild, dry). Negative for shortness of breath.   Cardiovascular: Positive for chest pain. Negative for palpitations and leg swelling.  Gastrointestinal: Positive for abdominal pain (upper) and diarrhea. Negative for nausea and vomiting.  Musculoskeletal: Negative for arthralgias, back pain and  myalgias.  Skin: Negative for rash.  Neurological: Negative for dizziness and headaches.  All other systems reviewed and are negative.    Physical Exam Triage Vital Signs ED Triage Vitals  Enc Vitals Group     BP 09/02/19 1754 (!) 136/88     Pulse Rate 09/02/19 1754 86     Resp 09/02/19 1754 18     Temp 09/02/19 1754 98.2 F (36.8 C)     Temp Source 09/02/19 1754 Oral     SpO2 09/02/19 1754 98 %     Weight --      Height --      Head Circumference --      Peak Flow --      Pain Score 09/02/19 1756 4     Pain Loc --      Pain Edu? --      Excl. in GC? --    No data found.  Updated Vital  Signs BP (!) 136/88 (BP Location: Right Arm)   Pulse 86   Temp 98.2 F (36.8 C) (Oral)   Resp 18   SpO2 98%   Visual Acuity Right Eye Distance:   Left Eye Distance:   Bilateral Distance:    Right Eye Near:   Left Eye Near:    Bilateral Near:     Physical Exam Vitals and nursing note reviewed.  Constitutional:      General: She is not in acute distress.    Appearance: She is well-developed. She is not ill-appearing, toxic-appearing or diaphoretic.  HENT:     Head: Normocephalic and atraumatic.     Right Ear: Tympanic membrane and ear canal normal.     Left Ear: Tympanic membrane and ear canal normal.     Nose: Nose normal.     Mouth/Throat:     Lips: Pink.     Mouth: Mucous membranes are moist.     Pharynx: Oropharynx is clear. Uvula midline.  Cardiovascular:     Rate and Rhythm: Normal rate and regular rhythm.  Pulmonary:     Effort: Pulmonary effort is normal.     Breath sounds: No decreased breath sounds, wheezing, rhonchi or rales.  Chest:     Chest wall: No tenderness.  Abdominal:     Palpations: Abdomen is soft.     Tenderness: There is abdominal tenderness ( mild diffuse). There is no right CVA tenderness, left CVA tenderness, guarding or rebound.  Musculoskeletal:        General: Normal range of motion.     Cervical back: Normal range of motion.  Skin:    General: Skin is warm and dry.  Neurological:     Mental Status: She is alert and oriented to person, place, and time.  Psychiatric:        Behavior: Behavior normal.      UC Treatments / Results  Labs (all labs ordered are listed, but only abnormal results are displayed) Labs Reviewed  COMPLETE METABOLIC PANEL WITH GFR  LIPASE  POCT CBC W AUTO DIFF ([facility])    EKG Normal, see scanned EKG  Radiology DG Chest 2 View  Result Date: 09/02/2019 CLINICAL DATA:  Chest pain and diarrhea EXAM: CHEST - 2 VIEW COMPARISON:  None. FINDINGS: The heart size and mediastinal contours are within  normal limits. Both lungs are clear. The visualized skeletal structures are unremarkable. IMPRESSION: No active cardiopulmonary disease. Electronically Signed   By: Jasmine Pang M.D.   On: 09/02/2019 18:56   DG  Abdomen 1 View  Result Date: 09/02/2019 CLINICAL DATA:  Diarrhea EXAM: ABDOMEN - 1 VIEW COMPARISON:  None. FINDINGS: The bowel gas pattern is normal. No radio-opaque calculi or other significant radiographic abnormality are seen. Moderate stool in the colon. Probable phleboliths in the pelvis. IMPRESSION: Negative. Electronically Signed   By: Jasmine Pang M.D.   On: 09/02/2019 18:59    Procedures Procedures (including critical care time)  Medications Ordered in UC Medications  alum & mag hydroxide-simeth (MAALOX/MYLANTA) 200-200-20 MG/5ML suspension 30 mL (30 mLs Oral Given 09/02/19 1821)    And  lidocaine (XYLOCAINE) 2 % viscous mouth solution 15 mL (15 mLs Oral Given 09/02/19 1821)    Initial Impression / Assessment and Plan / UC Course  I have reviewed the triage vital signs and the nursing notes.  Pertinent labs & imaging results that were available during my care of the patient were reviewed by me and considered in my medical decision making (see chart for details).    Discussed normal EKG and plain films. Pt was given a GI cocktail prior to going down for imaging. Pt states pain has returned several times since taking the medication. Pt states pain is still severe when it comes on. Recommend pt be evaluated further in emergency department. Pt agreeable, declined EMS transport. Pt feels safe driving herself to [facility] Emergency Department. AVS provided.  Final Clinical Impressions(s) / UC Diagnoses   Final diagnoses:  Diarrhea, unspecified type  Abdominal pain, epigastric  Chest pain, unspecified type     Discharge Instructions      Because the medication given in urgent care has not helped your symptoms and the pain is still severe, it is recommended you  are evaluated further in the emergency department.  You have declined EMS transport. Please drive yourself safely and directly to the hospital. If you need to pull over, pull to a safe area and call 911.  Do not eat or drink anything along the way.     ED Prescriptions    None     PDMP not reviewed this encounter.     Lurene Shadow, [place] 09/02/19 1923

## 2019-09-02 NOTE — ED Notes (Signed)
Patient is being discharged from the Urgent Care and sent to the Emergency Department via POV . Per Waylan Rocher, PAC, patient is in need of higher level of care due to intermittent chest pain. Patient is aware and verbalizes understanding of plan of care.  Vitals:   09/02/19 1754  BP: (!) 136/88  Pulse: 86  Resp: 18  Temp: 98.2 F (36.8 C)  SpO2: 98%

## 2019-09-03 LAB — COMPLETE METABOLIC PANEL WITH GFR
AG Ratio: 1.6 (calc) (ref 1.0–2.5)
ALT: 16 U/L (ref 6–29)
AST: 18 U/L (ref 10–35)
Albumin: 4.5 g/dL (ref 3.6–5.1)
Alkaline phosphatase (APISO): 96 U/L (ref 37–153)
BUN: 9 mg/dL (ref 7–25)
CO2: 28 mmol/L (ref 20–32)
Calcium: 9.9 mg/dL (ref 8.6–10.4)
Chloride: 105 mmol/L (ref 98–110)
Creat: 0.94 mg/dL (ref 0.50–1.05)
GFR, Est African American: 81 mL/min/{1.73_m2} (ref >=60)
GFR, Est Non African American: 70 mL/min/{1.73_m2} (ref >=60)
Globulin: 2.9 g/dL (calc) (ref 1.9–3.7)
Glucose, Bld: 92 mg/dL (ref 65–99)
Potassium: 3.9 mmol/L (ref 3.5–5.3)
Sodium: 142 mmol/L (ref 135–146)
Total Bilirubin: 1.1 mg/dL (ref 0.2–1.2)
Total Protein: 7.4 g/dL (ref 6.1–8.1)

## 2019-09-03 LAB — LIPASE: Lipase: 48 U/L (ref 7–60)

## 2019-12-04 ENCOUNTER — Other Ambulatory Visit: Payer: Self-pay

## 2019-12-04 ENCOUNTER — Encounter: Payer: Self-pay | Admitting: Urgent Care

## 2019-12-04 ENCOUNTER — Emergency Department
Admission: EM | Admit: 2019-12-04 | Discharge: 2019-12-04 | Disposition: A | Discharge status: Home / Self Care | Payer: BC Managed Care – PPO | Source: Home / Self Care

## 2019-12-04 DIAGNOSIS — J0100 Acute maxillary sinusitis, unspecified: Secondary | ICD-10-CM

## 2019-12-04 DIAGNOSIS — J069 Acute upper respiratory infection, unspecified: Secondary | ICD-10-CM

## 2019-12-04 DIAGNOSIS — I10 Essential (primary) hypertension: Secondary | ICD-10-CM

## 2019-12-04 MED ORDER — PSEUDOEPHEDRINE HCL 30 MG PO TABS
30.0000 mg | ORAL_TABLET | Freq: Two times a day (BID) | ORAL | 0 refills | Status: AC | PRN
Start: 2019-12-04 — End: ?

## 2019-12-04 MED ORDER — CETIRIZINE HCL 10 MG PO TABS
10.0000 mg | ORAL_TABLET | Freq: Every day | ORAL | 0 refills | Status: DC
Start: 2019-12-04 — End: 2022-10-05

## 2019-12-04 MED ORDER — BENZONATATE 100 MG PO CAPS: 100.0000 mg | ORAL_CAPSULE | Freq: Three times a day (TID) | ORAL | 0 refills | Status: DC | PRN

## 2019-12-04 NOTE — ED Provider Notes (Signed)
[facility]   MRN: [id number] DOB: Dec 17, 1970  Subjective:   Allison Werner is a 49 y.o. female presenting for 2-day history of acute onset headache, fatigue, nausea without vomiting, cough and sinus congestion.  Patient states that she had her Pfizer vaccine about a week ago.  Denies fever, chest pain, shortness of breath.  No current facility-administered medications for this encounter.  Current Outpatient Medications:    buPROPion (WELLBUTRIN XL) 150 MG 24 hr tablet, TAKE 1 TABLET BY MOUTH EVERY DAY IN THE MORNING, Disp: , Rfl:    cloNIDine (CATAPRES) 0.1 MG tablet, Take by mouth., Disp: , Rfl:    desvenlafaxine (PRISTIQ) 50 MG 24 hr tablet, Take 50 mg by mouth daily.  , Disp: , Rfl:    fluticasone (FLONASE) 50 MCG/ACT nasal spray, Place 2 sprays into both nostrils daily., Disp: 15.8 g, Rfl: 2   nebivolol (BYSTOLIC) 5 MG tablet, TAKE ONE TABLET (5 MG DOSE) BY MOUTH DAILY., Disp: , Rfl:    hydrochlorothiazide (HYDRODIURIL) 25 MG tablet, Take by mouth., Disp: , Rfl:    magic mouthwash w/lidocaine SOLN, Take 5 mLs by mouth 3 (three) times daily as needed for mouth pain., Disp: 60 mL, Rfl: 0   predniSONE (DELTASONE) 20 MG tablet, 3 tabs po day one, then 2 po daily x 4 days, Disp: 11 tablet, Rfl: 0   No Known Allergies  Past Medical History:  Diagnosis Date   Anxiety    Hormone replacement therapy (postmenopausal)      Past Surgical History:  Procedure Laterality Date   ABDOMINAL HYSTERECTOMY      History reviewed. No pertinent family history.  Social History   Tobacco Use   Smoking status: Never Smoker   Smokeless tobacco: Never Used  Vaping Use   Vaping Use: Never used  Substance Use Topics   Alcohol use: No   Drug use: No    ROS   Objective:   Vitals: BP (!) 156/108 (BP Location: Right Arm)    Pulse 82    Temp 98.9 F (37.2 C) (Tympanic)    Ht 5\' 6"  (1.676 m)    Wt 175 lb (79.4 kg)    SpO2 99%    BMI 28.25 kg/m   Physical  Exam Constitutional:      General: She is not in acute distress.    Appearance: Normal appearance. She is well-developed. She is not ill-appearing, toxic-appearing or diaphoretic.  HENT:     Head: Normocephalic and atraumatic.     Nose: Nose normal.     Mouth/Throat:     Mouth: Mucous membranes are moist.  Eyes:     Extraocular Movements: Extraocular movements intact.     Pupils: Pupils are equal, round, and reactive to light.  Cardiovascular:     Rate and Rhythm: Normal rate and regular rhythm.     Pulses: Normal pulses.     Heart sounds: Normal heart sounds. No murmur heard.  No friction rub. No gallop.   Pulmonary:     Effort: Pulmonary effort is normal. No respiratory distress.     Breath sounds: Normal breath sounds. No stridor. No wheezing, rhonchi or rales.  Skin:    General: Skin is warm and dry.     Findings: No rash.  Neurological:     Mental Status: She is alert and oriented to person, place, and time.     Cranial Nerves: No cranial nerve deficit.     Motor: No weakness.  Coordination: Romberg sign negative. Coordination normal.     Gait: Gait normal.  Psychiatric:        Mood and Affect: Mood normal. Mood is not anxious or depressed.        Speech: Speech normal.        Behavior: Behavior normal. Behavior is not agitated.        Thought Content: Thought content normal.      Assessment and Plan :   PDMP not reviewed this encounter.  1. Viral URI with cough   2. Essential hypertension   3. Elevated blood pressure reading with diagnosis of hypertension   4. Acute non-recurrent maxillary sinusitis     Will manage for viral illness such as viral URI, viral syndrome, viral rhinitis, COVID-19. Counseled patient on nature of COVID-19 including modes of transmission, diagnostic testing, management and supportive care.  Offered scripts for symptomatic relief. COVID 19 testing is pending. Counseled patient on potential for adverse effects with medications  prescribed/recommended today, ER and return-to-clinic precautions discussed, patient verbalized understanding.     Wallis Bamberg, PA-C 12/04/19 1551

## 2019-12-04 NOTE — Discharge Instructions (Signed)
We will notify you of your COVID-19 test results as they arrive and may take between 24 to 48 hours.  I encourage you to sign up for MyChart if you have not already done so as this can be the easiest way for [place] to communicate results to you online or through a phone app.  In the meantime, if you develop worsening symptoms including fever, chest pain, shortness of breath despite our current treatment plan then please report to the emergency room as this may be a sign of worsening status from possible COVID-19 infection.  Otherwise, we will manage this as a viral syndrome. For sore throat or cough try using a honey-based tea. Use 3 teaspoons of honey with juice squeezed from half lemon. Place shaved pieces of ginger into 1/2-1 cup of water and warm over stove top. Then mix the ingredients and repeat every 4 hours as needed. Please take Tylenol 500mg -650mg  every 6 hours for aches and pains, fevers. Hydrate very well with at least 2 liters of water. Eat light meals such as soups to replenish electrolytes and soft fruits, veggies. Start an antihistamine like Zyrtec, Allegra or Claritin for postnasal drainage, sinus congestion.  You can take this together with pseudoephedrine (Sudafed) at a dose of 30 mg 2 times a day as needed for the same kind of congestion.

## 2019-12-04 NOTE — ED Triage Notes (Signed)
Pt states that she has a headache, fatigue, nauseous. No vomiting. x2 days. Pt has 1 of the pzifer vaccines and go next month for her next one.

## 2019-12-05 LAB — NOVEL CORONAVIRUS, NAA: SARS-CoV-2, NAA: NOT DETECTED

## 2019-12-05 LAB — SARS-COV-2, NAA 2 DAY TAT

## 2020-02-03 ENCOUNTER — Other Ambulatory Visit: Payer: Self-pay

## 2020-02-03 ENCOUNTER — Emergency Department
Admission: EM | Admit: 2020-02-03 | Discharge: 2020-02-03 | Disposition: A | Discharge status: Home / Self Care | Payer: BC Managed Care – PPO | Source: Home / Self Care | Attending: Family Medicine | Admitting: Family Medicine

## 2020-02-03 DIAGNOSIS — R059 Cough, unspecified: Secondary | ICD-10-CM

## 2020-02-03 DIAGNOSIS — J069 Acute upper respiratory infection, unspecified: Secondary | ICD-10-CM

## 2020-02-03 HISTORY — DX: Essential (primary) hypertension: I10

## 2020-02-03 NOTE — ED Triage Notes (Signed)
Pt presents to Urgent Care with c/o scratchy throat, cough, and generalized body aches x 2 days. Afebrile. Pt w/ no known COVID or flu exposure; has been vaccinated against both.

## 2020-02-03 NOTE — Discharge Instructions (Signed)
Be aware, your cough medication may cause drowsiness. Please do not drive, operate heavy machinery or make important decisions while on this medication, it can cloud your judgement.  Follow up with your primary care doctor or here if you are not seeing improvement of your symptoms over the next several days, sooner if you feel you are worsening.  Caring for yourself: Get plenty of rest. Drink plenty of fluids, enough so that your urine is light yellow or clear like water. If you have kidney, heart, or liver disease and have to limit fluids, talk with your doctor before you increase the amount of fluids you drink. Take an over-the-counter pain medicine if needed, such as acetaminophen (Tylenol), ibuprofen (Advil, Motrin), or naproxen (Aleve), to relieve fever, headache, and muscle aches. Read and follow all instructions on the label. No one younger than 20 should take aspirin. It has been linked to Reye syndrome, a serious illness. Before you use over the counter cough and cold medicines, check the label. These medicines may not be safe for children younger than age 6 or for people with certain health problems. If the skin around your nose and lips becomes sore, put some petroleum jelly on the area.  Avoid spreading a respiratory virus: Wash your hands regularly, and keep your hands away from your face.  Stay home from school, work, and other public places until you are feeling better and your fever has been gone for at least 24 hours. The fever needs to have gone away on its own without the help of medicine.  

## 2020-02-03 NOTE — ED Provider Notes (Signed)
  [facility]   [id number] 02/03/20 Arrival Time: 6237  ASSESSMENT & PLAN:  1. Cough   2. Viral URI with cough     COVID-19 and influenza testing sent.  Prefers OTC symptom care as needed.    Follow-up Information    Primus Bravo, NP.   Specialty: Family Medicine Why: As needed. Contact information: [address] [place] [address] [phone]               Reviewed expectations re: course of current medical issues. Questions answered. Outlined signs and symptoms indicating need for more acute intervention. Understanding verbalized. After Visit Summary given.   SUBJECTIVE: History from: patient. Allison Werner is a 49 y.o. female who reports scratchy throat, cough, and generalized body aches; fairly quick onset over past couple of days. Denies: fever and difficulty breathing. Normal PO intake without n/v/d. Ibuprofen with some help.  OBJECTIVE:  Vitals:   02/03/20 0842 02/03/20 0849  BP:  (!) 136/91  Pulse:  (!) 103  Resp:  20  Temp:  98.8 F (37.1 C)  TempSrc:  Oral  SpO2:  98%  Weight: 77.1 kg   Height: 5\' 6"  (1.676 m)     General appearance: alert; no distress Eyes: conjunctiva normal HENT: [place]; AT; with nasal congestion Neck: supple  Lungs: speaks full sentences without difficulty; unlabored Extremities: no edema Skin: warm and dry Neurologic: normal gait Psychological: alert and cooperative; normal mood and affect  Labs:  Labs Reviewed  COVID-19, FLU A+B NAA    No Known Allergies  Past Medical History:  Diagnosis Date  . Anxiety   . Hormone replacement therapy (postmenopausal)   . Hypertension    Social History   Socioeconomic History  . Marital status: Married    Spouse name: Not on file  . Number of children: Not on file  . Years of education: Not on file  . Highest education level: Not on file  Occupational History  . Not on file  Tobacco Use  . Smoking status: Never Smoker  . Smokeless tobacco:  Never Used  Vaping Use  . Vaping Use: Never used  Substance and Sexual Activity  . Alcohol use: No  . Drug use: No  . Sexual activity: Not on file  Other Topics Concern  . Not on file  Social History Narrative  . Not on file   Social Determinants of Health   Financial Resource Strain: Not on file  Food Insecurity: Not on file  Transportation Needs: Not on file  Physical Activity: Not on file  Stress: Not on file  Social Connections: Not on file  Intimate Partner Violence: Not on file   Family History  Problem Relation Age of Onset  . Healthy Mother   . Healthy Father    Past Surgical History:  Procedure Laterality Date  . ABDOMINAL HYSTERECTOMY       , MD 02/03/20 [phone]

## 2020-02-10 LAB — COVID-19, FLU A+B NAA
Influenza A, NAA: NOT DETECTED
Influenza B, NAA: NOT DETECTED
SARS-CoV-2, NAA: DETECTED — AB

## 2022-01-30 IMAGING — DX DG CHEST 2V
2 series · 2 of 2 positions shown · non-contrast
Comparison: None.

CLINICAL DATA: Chest pain and diarrhea

EXAM:
CHEST - 2 VIEW

[chest pa]
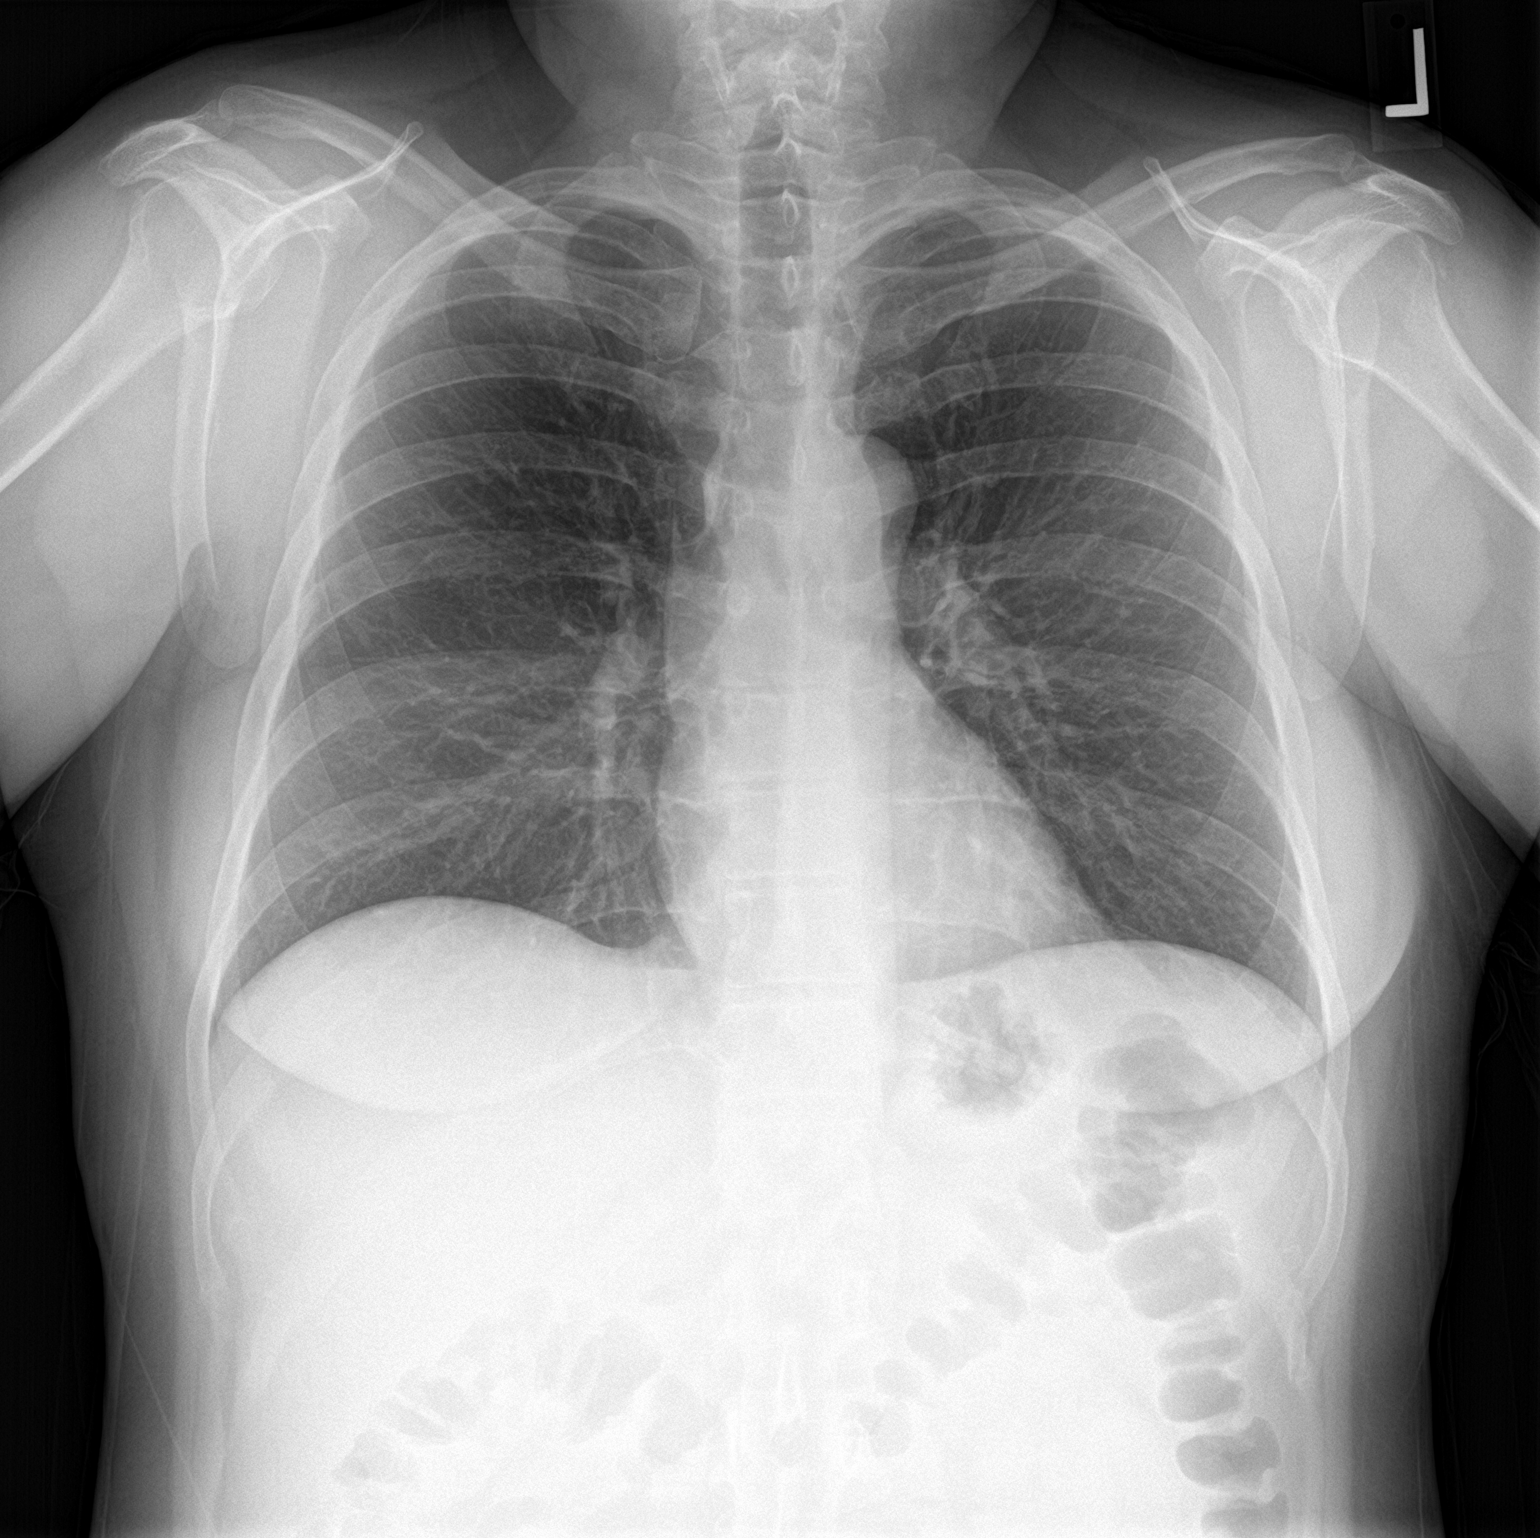

[chest lat]
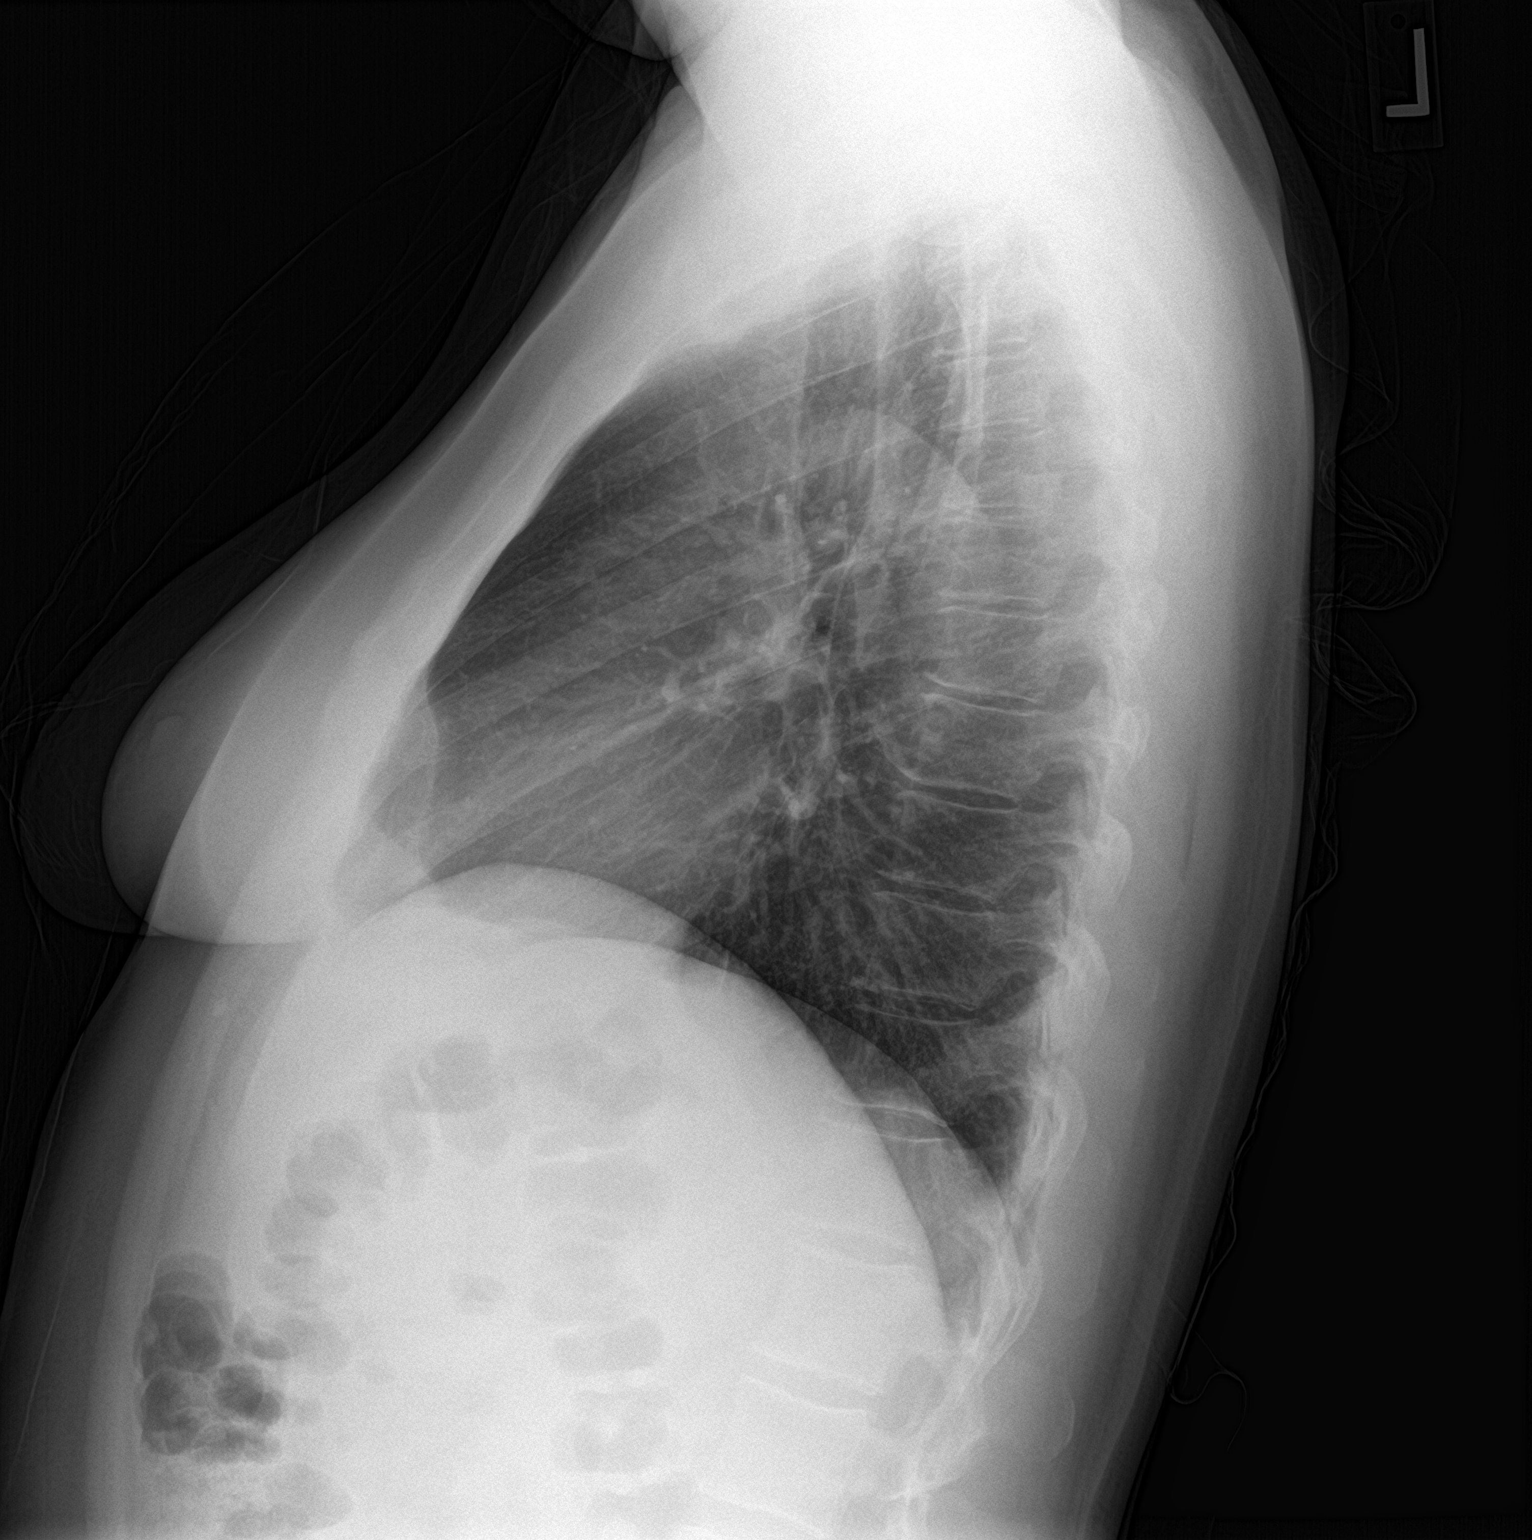

[2 of 2 positions shown; findings below may reference images not displayed]

FINDINGS: The heart size and mediastinal contours are within normal limits.
Both lungs are clear. The visualized skeletal structures are
unremarkable.
IMPRESSION: No active cardiopulmonary disease.

## 2022-10-05 ENCOUNTER — Other Ambulatory Visit: Payer: Self-pay

## 2022-10-05 ENCOUNTER — Ambulatory Visit
Admission: EM | Admit: 2022-10-05 | Discharge: 2022-10-05 | Disposition: A | Discharge status: Home / Self Care | Payer: BC Managed Care – PPO | Attending: Family Medicine | Admitting: Family Medicine

## 2022-10-05 DIAGNOSIS — J0100 Acute maxillary sinusitis, unspecified: Secondary | ICD-10-CM

## 2022-10-05 DIAGNOSIS — H6692 Otitis media, unspecified, left ear: Secondary | ICD-10-CM | POA: Diagnosis not present

## 2022-10-05 DIAGNOSIS — J309 Allergic rhinitis, unspecified: Secondary | ICD-10-CM

## 2022-10-05 DIAGNOSIS — R059 Cough, unspecified: Secondary | ICD-10-CM

## 2022-10-05 MED ORDER — AMOXICILLIN-POT CLAVULANATE 875-125 MG PO TABS: 1.0000 | ORAL_TABLET | Freq: Two times a day (BID) | ORAL | 0 refills | Status: AC

## 2022-10-05 MED ORDER — PREDNISONE 10 MG (21) PO TBPK: ORAL_TABLET | Freq: Every day | ORAL | 0 refills | Status: AC

## 2022-10-05 MED ORDER — PROMETHAZINE-DM 6.25-15 MG/5ML PO SYRP: 5.0000 mL | ORAL_SOLUTION | Freq: Two times a day (BID) | ORAL | 0 refills | Status: AC | PRN

## 2022-10-05 MED ORDER — FEXOFENADINE HCL 180 MG PO TABS: 180.0000 mg | ORAL_TABLET | Freq: Every day | ORAL | 0 refills | Status: AC

## 2022-10-05 MED ORDER — BENZONATATE 200 MG PO CAPS: 200.0000 mg | ORAL_CAPSULE | Freq: Three times a day (TID) | ORAL | 0 refills | Status: AC | PRN

## 2022-10-05 NOTE — Discharge Instructions (Addendum)
Advised patient to take medications as directed with food to completion.  Advised patient to take prednisone and Allegra with first dose of Augmentin for the next 10 days.  Advised may discontinue Allegra after 5 days and use as needed for concurrent postnasal drainage/drip.  Advised may take Tessalon capsules daily or as needed for cough.  Advised may use Promethazine DM at night for cough due to sedative effects.  Encouraged increase daily water intake to 64 ounces per day while taking these medications.  Advised if symptoms worsen and/or unresolved please follow-up PCP or here for further evaluation.

## 2022-10-05 NOTE — ED Triage Notes (Signed)
Pt c/o cough that started Wed. Runny nose and generalized bodyaches to follow. COVID test at home neg yesterday. Taking tylenol prn. Hx of sinus infections.

## 2022-10-05 NOTE — ED Provider Notes (Signed)
[name]    CSN: [id number] Arrival date & time: 10/05/22  0844      History   Chief Complaint Chief Complaint  Patient presents with   Cough   Nasal Congestion   Generalized Body Aches    HPI Allison Werner is a 52 y.o. female.   HPI Pleasant 52 year old female presents with cough, nasal congestion and generalized bodyaches for 4 days.  Patient reports negative home COVID-19 test yesterday.  Reports history of sinus infections.  PMH significant for obesity, HTN, and anxiety.  Past Medical History:  Diagnosis Date   Anxiety    Hormone replacement therapy (postmenopausal)    Hypertension     Patient Active Problem List   Diagnosis Date Noted   SINUSITIS, MAXILLARY, ACUTE 03/11/2010    Past Surgical History:  Procedure Laterality Date   ABDOMINAL HYSTERECTOMY      OB History   No obstetric history on file.      Home Medications    Prior to Admission medications   Medication Sig Start Date End Date Taking? Authorizing Provider  amoxicillin-clavulanate (AUGMENTIN) 875-125 MG tablet Take 1 tablet by mouth 2 (two) times daily for 10 days. 10/05/22 10/15/22 Yes Trevor Iha, FNP  benzonatate (TESSALON) 200 MG capsule Take 1 capsule (200 mg total) by mouth 3 (three) times daily as needed for up to 7 days. 10/05/22 10/12/22 Yes Trevor Iha, FNP  fexofenadine [facility] ALLERGY) 180 MG tablet Take 1 tablet (180 mg total) by mouth daily for 15 days. 10/05/22 10/20/22 Yes Trevor Iha, FNP  predniSONE (STERAPRED UNI-PAK 21 TAB) 10 MG (21) TBPK tablet Take by mouth daily. Take 6 tabs by mouth daily  for 2 days, then 5 tabs for 2 days, then 4 tabs for 2 days, then 3 tabs for 2 days, 2 tabs for 2 days, then 1 tab by mouth daily for 2 days 10/05/22  Yes Trevor Iha, FNP  promethazine-dextromethorphan (PROMETHAZINE-DM) 6.25-15 MG/5ML syrup Take 5 mLs by mouth 2 (two) times daily as needed for cough. 10/05/22  Yes Trevor Iha, FNP  buPROPion (WELLBUTRIN XL) 150 MG  24 hr tablet TAKE 1 TABLET BY MOUTH EVERY DAY IN THE MORNING 06/28/19   [provider]  cloNIDine (CATAPRES) 0.1 MG tablet Take by mouth. 01/25/19   [provider]  desvenlafaxine (PRISTIQ) 50 MG 24 hr tablet Take 50 mg by mouth daily.      [provider]  fluticasone (FLONASE) 50 MCG/ACT nasal spray Place 2 sprays into both nostrils daily. 01/21/16   Lurene Shadow, PA-C  hydrochlorothiazide (HYDRODIURIL) 25 MG tablet Take by mouth. 08/06/18   [provider]  nebivolol (BYSTOLIC) 5 MG tablet TAKE ONE TABLET (5 MG DOSE) BY MOUTH DAILY. 05/21/19   [provider]  pseudoephedrine (SUDAFED) 30 MG tablet Take 1 tablet (30 mg total) by mouth 2 (two) times daily as needed for congestion. 12/04/19   Wallis Bamberg, PA-C    Family History Family History  Problem Relation Age of Onset   Healthy Mother    Healthy Father     Social History Social History   Tobacco Use   Smoking status: Never   Smokeless tobacco: Never  Vaping Use   Vaping status: Never Used  Substance Use Topics   Alcohol use: No   Drug use: No     Allergies   Patient has no known allergies.   Review of Systems Review of Systems  HENT:  Positive for congestion, sinus pressure and sinus pain.  Respiratory:  Positive for cough.   All other systems reviewed and are negative.    Physical Exam Triage Vital Signs ED Triage Vitals  Encounter Vitals Group     BP 10/05/22 0903 (!) 148/89     Systolic BP Percentile --      Diastolic BP Percentile --      Pulse Rate 10/05/22 0903 87     Resp 10/05/22 0903 17     Temp 10/05/22 0903 98.5 F (36.9 C)     Temp Source 10/05/22 0903 Oral     SpO2 10/05/22 0903 97 %     Weight --      Height --      Head Circumference --      Peak Flow --      Pain Score 10/05/22 0904 0     Pain Loc --      Pain Education --      Exclude from Growth Chart --    No data found.  Updated Vital Signs BP (!) 148/89 (BP Location: Right Arm)    Pulse 87   Temp 98.5 F (36.9 C) (Oral)   Resp 17   SpO2 97%     Physical Exam Vitals and nursing note reviewed.  Constitutional:      General: She is not in acute distress.    Appearance: Normal appearance. She is obese. She is ill-appearing.  HENT:     Head: Normocephalic and atraumatic.     Right Ear: Tympanic membrane and external ear normal.     Left Ear: External ear normal.     Ears:     Comments: Left TM: Red rimmed, erythematous, retracted; moderate eustachian tube dysfunction noted of bilateral EAC's    Nose:     Comments: Turbinates are erythematous/edematous    Mouth/Throat:     Mouth: Mucous membranes are moist.     Pharynx: Oropharynx is clear.     Comments: Significant amount of clear drainage of posterior oropharynx with concurrent posterior nasopharyngeal cobblestoning noted Eyes:     Extraocular Movements: Extraocular movements intact.     Conjunctiva/sclera: Conjunctivae normal.     Pupils: Pupils are equal, round, and reactive to light.  Cardiovascular:     Pulses: Normal pulses.     Heart sounds: Normal heart sounds.  Pulmonary:     Effort: Pulmonary effort is normal.     Breath sounds: Normal breath sounds. No wheezing, rhonchi or rales.     Comments: Frequent nonproductive cough noted on exam Musculoskeletal:        General: Normal range of motion.     Cervical back: Normal range of motion and neck supple.  Skin:    General: Skin is warm and dry.  Neurological:     General: No focal deficit present.     Mental Status: She is alert and oriented to person, place, and time. Mental status is at baseline.  Psychiatric:        Mood and Affect: Mood normal.        Behavior: Behavior normal.        Thought Content: Thought content normal.      UC Treatments / Results  Labs (all labs ordered are listed, but only abnormal results are displayed) Labs Reviewed - No data to display  EKG   Radiology No results found.  Procedures Procedures  (including critical care time)  Medications Ordered in UC Medications - No data to display  Initial Impression / Assessment and Plan /  UC Course  I have reviewed the triage vital signs and the nursing notes.  Pertinent labs & imaging results that were available during my care of the patient were reviewed by me and considered in my medical decision making (see chart for details).     MDM: 1.  Acute left otitis media-Rx'd Augmentin 875/125 mg tablet: Take 1 tablet twice daily x 10 days; 2.  Cough, unspecified type-Rx'd Sterapred Unipak (tapering from 60 mg to 10 mg over 10 days) Tessalon 200 mg capsules 3 times daily, as needed, promethazine DM 6.25-15 mg / 5 mL syrup: Take 5 mL by mouth twice daily as needed for cough; 3.  Acute maxillary sinusitis, recurrence not specified-875/125 mg tablet: Take 1 tablet twice daily x 10 days; 4.  Allergic rhinitis, unspecified seasonality, unspecified trigger-Rx'd Allegra 180 mg fexofenadine daily x 5 days, then as needed for concurrent postnasal drainage/drip. Advised patient to take medications as directed with food to completion.  Advised patient to take prednisone and Allegra with first dose of Augmentin for the next 10 days.  Advised may discontinue Allegra after 5 days and use as needed for concurrent postnasal drainage/drip.  Advised may take Tessalon capsules daily or as needed for cough.  Advised may use Promethazine DM at night for cough due to sedative effects.  Encouraged increase daily water intake to 64 ounces per day while taking these medications.  Advised if symptoms worsen and/or unresolved please follow-up PCP or here for further evaluation.  Patient discharged home, hemodynamically stable.  Final Clinical Impressions(s) / UC Diagnoses   Final diagnoses:  Cough, unspecified type  Acute left otitis media  Allergic rhinitis, unspecified seasonality, unspecified trigger  Acute maxillary sinusitis, recurrence not specified     Discharge  Instructions      Advised patient to take medications as directed with food to completion.  Advised patient to take prednisone and Allegra with first dose of Augmentin for the next 10 days.  Advised may discontinue Allegra after 5 days and use as needed for concurrent postnasal drainage/drip.  Advised may take Tessalon capsules daily or as needed for cough.  Advised may use Promethazine DM at night for cough due to sedative effects.  Encouraged increase daily water intake to 64 ounces per day while taking these medications.  Advised if symptoms worsen and/or unresolved please follow-up PCP or here for further evaluation.     ED Prescriptions     Medication Sig Dispense Auth. Provider   amoxicillin-clavulanate (AUGMENTIN) 875-125 MG tablet Take 1 tablet by mouth 2 (two) times daily for 10 days. 20 tablet Trevor Iha, FNP   predniSONE (STERAPRED UNI-PAK 21 TAB) 10 MG (21) TBPK tablet Take by mouth daily. Take 6 tabs by mouth daily  for 2 days, then 5 tabs for 2 days, then 4 tabs for 2 days, then 3 tabs for 2 days, 2 tabs for 2 days, then 1 tab by mouth daily for 2 days 42 tablet Trevor Iha, FNP   benzonatate (TESSALON) 200 MG capsule Take 1 capsule (200 mg total) by mouth 3 (three) times daily as needed for up to 7 days. 40 capsule Trevor Iha, FNP   fexofenadine [facility] ALLERGY) 180 MG tablet Take 1 tablet (180 mg total) by mouth daily for 15 days. 15 tablet Trevor Iha, FNP   promethazine-dextromethorphan (PROMETHAZINE-DM) 6.25-15 MG/5ML syrup Take 5 mLs by mouth 2 (two) times daily as needed for cough. 118 mL Trevor Iha, FNP      PDMP not reviewed this encounter.  Trevor Iha, FNP 10/05/22 [phone]
